# Patient Record
Sex: Male | Born: 1970 | Race: White | Hispanic: No | Marital: Married | Smoking: Current every day smoker
Health system: Southern US, Community
[De-identification: ages and names within clinical notes are randomized; demographics above are authoritative.]

## PROBLEM LIST (undated history)

## (undated) ENCOUNTER — Emergency Department: Admission: EM | Discharge status: Absent without leave | Payer: Self-pay

## (undated) HISTORY — PX: SINUS SURGERY WITH INSTATRAK: SHX5215

## (undated) HISTORY — PX: ANKLE SURGERY: SHX546

## (undated) HISTORY — PX: CHOLECYSTECTOMY: SHX55

---

## 2011-09-29 ENCOUNTER — Emergency Department
Admission: EM | Admit: 2011-09-29 | Discharge: 2011-09-29 | Disposition: A | Discharge status: Home / Self Care | Payer: Managed Care, Other (non HMO) | Source: Home / Self Care

## 2011-09-29 ENCOUNTER — Emergency Department
Admit: 2011-09-29 | Discharge: 2011-09-29 | Disposition: A | Discharge status: Home / Self Care | Payer: Managed Care, Other (non HMO)

## 2011-09-29 DIAGNOSIS — IMO0001 Reserved for inherently not codable concepts without codable children: Secondary | ICD-10-CM

## 2011-09-29 DIAGNOSIS — R059 Cough, unspecified: Secondary | ICD-10-CM

## 2011-09-29 DIAGNOSIS — R5381 Other malaise: Secondary | ICD-10-CM

## 2011-09-29 DIAGNOSIS — R5383 Other fatigue: Secondary | ICD-10-CM

## 2011-09-29 DIAGNOSIS — R05 Cough: Secondary | ICD-10-CM

## 2011-09-29 MED ORDER — BENZONATATE 100 MG PO CAPS: 100.0000 mg | ORAL_CAPSULE | Freq: Three times a day (TID) | ORAL | Status: AC | PRN

## 2011-09-29 NOTE — ED Provider Notes (Signed)
Subjective:    Patient ID: Eugene Fleming is a 41 y.o. male.  Chief Complaint:  HPI Comments: Flu like sxs x 5 days. + sick contacts in children with similar sxs. Pt with a baseline hx/o smoking (1/2 PPD). Pt has not had flu shot this year.   Patient is a 41 y.o. male presenting with fever.  Fever Primary symptoms of the febrile illness include fever, fatigue, headaches, cough, nausea, vomiting and myalgias. Primary symptoms do not include wheezing, shortness of breath or dysuria. The current episode started 3 to 5 days ago. This is a new problem. The problem has not changed since onset. Associated with: + sick contact in children with similar symptoms  Risk factors: morbid obesity, 1/2 PPD smoker   No past medical history on file.  No past surgical history on file.  Family History  Problem Relation Age of Onset  . Diabetes Father     History  Substance Use Topics  . Smoking status: Not on file  . Smokeless tobacco: Not on file  . Alcohol Use:     Review of Systems  Constitutional: Positive for fever and fatigue.  HENT: Positive for congestion, rhinorrhea and postnasal drip. Negative for sore throat and trouble swallowing.   Respiratory: Positive for cough. Negative for shortness of breath and wheezing.   Cardiovascular:       + pleuritic chest pain with persistent coughing   Gastrointestinal: Positive for nausea and vomiting.  Genitourinary: Negative for dysuria.  Musculoskeletal: Positive for myalgias.  Neurological: Positive for headaches.    Objective:   BP 107/74  Pulse 105  Temp 99 F (37.2 C)  Resp 24  Ht 6\' 4"  (1.93 m)  Wt 323 lb 12 oz (146.852 kg)  BMI 39.41 kg/m2  SpO2 99%  Physical Exam  Constitutional: He is oriented to person, place, and time.       Obese, generally ill appearing   HENT:       TMs clear bilaterally, +nasal erythema, rhinorrhea bilaterally, + post oropharyngeal erythema    Eyes: Pupils are equal, round, and reactive to light.    Neck:       Large neck girth   Cardiovascular: Normal rate and regular rhythm.   Pulmonary/Chest: Effort normal and breath sounds normal. No respiratory distress. He has no wheezes. He has no rales.  Abdominal:       Obese abdomen, non tender   Neurological: He is alert and oriented to person, place, and time.    Assessment:   Procedures CHEST - 2 VIEW  Comparison: None.  Findings: Two views of the chest were obtained. Heart and  mediastinum are within normal limits. The trachea is midline.  Lungs are clear. The bony thorax is intact.  IMPRESSION:  No acute chest findings.  Original Report Authenticated By: Richarda Overlie, M.D.       There were no encounter diagnoses.  Plan:  Likely viral URI. CXR negative for pneumonia in setting of smoking history. Discussed supportive care and cardiorespiratory red flags for return. Tessalon perles for cough. Handout given.

## 2011-09-29 NOTE — ED Notes (Signed)
Cough, congestion and fever started Wed. T- Max= 102.0

## 2011-09-29 NOTE — ED Provider Notes (Signed)
Pt seen by Dr. Doroteo Bradford, MD 09/29/11 1536

## 2011-09-29 NOTE — Discharge Instructions (Signed)
Influenza, Adult Influenza ("the flu") is a viral infection of the respiratory tract. It causes chills, fever, cough, headache, body aches, and sore throat. Influenza in general will make you feel sicker than when you have a common cold. Symptoms of the illness typically last a few days. Cough and fatigue may continue for as [place] as 7 to 10 days. Influenza is highly contagious. It spreads easily to others in the droplets from coughs and sneezes. People frequently become infected by touching something that was recently contaminated with the virus and then touch their mouth, nose or eyes. This infection is caused by a virus. Symptoms will not be reduced or improved by taking an antibiotic. Antibiotics are medications that kill bacteria, not viruses. DIAGNOSIS  Diagnosis of influenza is often made based on the history and physical examination as well as the presence of influenza reports occurring in your community. Testing can be done if the diagnosis is not certain. TREATMENT  Since influenza is caused by a virus, antibiotics are not helpful. Your caregiver may prescribe antiviral medicines to shorten the illness and lessen the severity. Your caregiver may also recommend influenza vaccination and/or antiviral medicines for your family members in order to prevent the spread of influenza to them. HOME CARE INSTRUCTIONS  DO NOT GIVE ASPIRIN TO PERSONS WITH INFLUENZA WHO ARE UNDER 40 YEARS OF AGE. This could lead to brain and liver damage (Reye's syndrome). Read the label on over-the-counter medicines.   Stay home from work or school if at all possible until most of your symptoms are gone.   Only take over-the-counter or prescription medicines for pain, discomfort, or fever as directed by your caregiver.   Use a cool mist humidifier to increase air moisture. This will make breathing easier.   Rest until your temperature is nearly normal: 98.6 F (37 C). This usually takes 3 to 4 days. Be sure you get  plenty of rest.   Drink at least eight, eight-ounce glasses of fluids per day. Fluids include water, juice, broth, gelatin, or lemonade.   Cover your mouth and nose when coughing or sneezing and wash your hands often to prevent the spread of this virus to other persons.  PREVENTION  Annual influenza vaccination (flu shots) is the best way to avoid getting influenza. An annual flu shot is now routinely recommended for all adults in the [place] SEEK MEDICAL CARE IF:   You develop shortness of breath while resting.   You have a deep cough with production of mucous or chest pain.   You develop nausea (feeling sick to your stomach), vomiting, or diarrhea.   Fever despite high dose ibuprofen 800mg  every 6 hours and tylenol 1000mg  every 8 hours SEEK IMMEDIATE MEDICAL CARE IF:   You have difficulty breathing, become short of breath, or your skin or nails turn bluish.   You develop severe neck pain or stiffness.   You develop a severe headache, facial pain, or earache.   You have a fever.   You develop nausea or vomiting that cannot be controlled.  Document Released: 07/06/2000 Document Revised: 06/28/2011 Document Reviewed: 05/11/2009 [facility] Patient Information 2012 [place], [place].

## 2015-08-30 ENCOUNTER — Encounter (HOSPITAL_BASED_OUTPATIENT_CLINIC_OR_DEPARTMENT_OTHER): Payer: Self-pay

## 2015-08-30 ENCOUNTER — Emergency Department (HOSPITAL_BASED_OUTPATIENT_CLINIC_OR_DEPARTMENT_OTHER)
Admission: EM | Admit: 2015-08-30 | Discharge: 2015-08-30 | Disposition: A | Discharge status: Home / Self Care | Payer: 59 | Attending: Emergency Medicine | Admitting: Emergency Medicine

## 2015-08-30 ENCOUNTER — Emergency Department (HOSPITAL_BASED_OUTPATIENT_CLINIC_OR_DEPARTMENT_OTHER): Payer: 59

## 2015-08-30 DIAGNOSIS — F17200 Nicotine dependence, unspecified, uncomplicated: Secondary | ICD-10-CM | POA: Insufficient documentation

## 2015-08-30 DIAGNOSIS — R1011 Right upper quadrant pain: Secondary | ICD-10-CM | POA: Diagnosis present

## 2015-08-30 DIAGNOSIS — N200 Calculus of kidney: Secondary | ICD-10-CM | POA: Insufficient documentation

## 2015-08-30 DIAGNOSIS — Z9049 Acquired absence of other specified parts of digestive tract: Secondary | ICD-10-CM | POA: Diagnosis not present

## 2015-08-30 LAB — COMPREHENSIVE METABOLIC PANEL
ALBUMIN: 4 g/dL (ref 3.5–5.0)
ALK PHOS: 76 U/L (ref 38–126)
ALT: 27 U/L (ref 17–63)
ANION GAP: 10 (ref 5–15)
AST: 22 U/L (ref 15–41)
BUN: 11 mg/dL (ref 6–20)
CO2: 26 mmol/L (ref 22–32)
Calcium: 9 mg/dL (ref 8.9–10.3)
Chloride: 105 mmol/L (ref 101–111)
Creatinine, Ser: 1.15 mg/dL (ref 0.61–1.24)
GFR calc Af Amer: >60 mL/min (ref >60)
GFR calc non Af Amer: >60 mL/min (ref >60)
GLUCOSE: 111 mg/dL — AB (ref 65–99)
POTASSIUM: 4 mmol/L (ref 3.5–5.1)
SODIUM: 141 mmol/L (ref 135–145)
Total Bilirubin: 0.6 mg/dL (ref 0.3–1.2)
Total Protein: 7.7 g/dL (ref 6.5–8.1)

## 2015-08-30 LAB — CBC WITH DIFFERENTIAL/PLATELET
BASOS ABS: 0 10*3/uL (ref 0.0–0.1)
BASOS PCT: 0 %
EOS ABS: 0.2 10*3/uL (ref 0.0–0.7)
Eosinophils Relative: 1 %
HEMATOCRIT: 47.2 % (ref 39.0–52.0)
HEMOGLOBIN: 15.7 g/dL (ref 13.0–17.0)
LYMPHS PCT: 12 %
Lymphs Abs: 2.7 10*3/uL (ref 0.7–4.0)
MCH: 28.4 pg (ref 26.0–34.0)
MCHC: 33.3 g/dL (ref 30.0–36.0)
MCV: 85.5 fL (ref 78.0–100.0)
MONO ABS: 1.1 10*3/uL — AB (ref 0.1–1.0)
Monocytes Relative: 5 %
NEUTROS PCT: 82 %
Neutro Abs: 18.8 10*3/uL — ABNORMAL HIGH (ref 1.7–7.7)
Platelets: 425 10*3/uL — ABNORMAL HIGH (ref 150–400)
RBC: 5.52 MIL/uL (ref 4.22–5.81)
RDW: 13.9 % (ref 11.5–15.5)
WBC: 22.8 10*3/uL — ABNORMAL HIGH (ref 4.0–10.5)

## 2015-08-30 LAB — URINE MICROSCOPIC-ADD ON

## 2015-08-30 LAB — URINALYSIS, ROUTINE W REFLEX MICROSCOPIC
Bilirubin Urine: NEGATIVE
GLUCOSE, UA: NEGATIVE mg/dL
Ketones, ur: NEGATIVE mg/dL
Nitrite: NEGATIVE
PH: 5.5 (ref 5.0–8.0)
PROTEIN: NEGATIVE mg/dL
SPECIFIC GRAVITY, URINE: 1.031 — AB (ref 1.005–1.030)

## 2015-08-30 MED ORDER — TAMSULOSIN HCL 0.4 MG PO CAPS: 0.4000 mg | ORAL_CAPSULE | Freq: Every day | ORAL | Status: AC

## 2015-08-30 MED ORDER — IOHEXOL 300 MG/ML  SOLN
25.0000 mL | Freq: Once | INTRAMUSCULAR | Status: AC | PRN
  Administered 2015-08-30: 25 mL via ORAL

## 2015-08-30 MED ORDER — IOHEXOL 300 MG/ML  SOLN
150.0000 mL | Freq: Once | INTRAMUSCULAR | Status: AC | PRN
  Administered 2015-08-30: 150 mL via INTRAVENOUS

## 2015-08-30 MED ORDER — ONDANSETRON 4 MG PO TBDP: 4.0000 mg | ORAL_TABLET | Freq: Three times a day (TID) | ORAL | Status: AC | PRN

## 2015-08-30 MED ORDER — MORPHINE SULFATE (PF) 4 MG/ML IV SOLN
4.0000 mg | Freq: Once | INTRAVENOUS | Status: AC
  Administered 2015-08-30: 4 mg via INTRAVENOUS
  Filled 2015-08-30: qty 1

## 2015-08-30 MED ORDER — IOHEXOL 300 MG/ML  SOLN: 25.0000 mL | Freq: Once | INTRAMUSCULAR | Status: DC | PRN

## 2015-08-30 MED ORDER — NAPROXEN 500 MG PO TABS: 500.0000 mg | ORAL_TABLET | Freq: Two times a day (BID) | ORAL | Status: AC

## 2015-08-30 MED ORDER — HYDROCODONE-ACETAMINOPHEN 5-325 MG PO TABS: 1.0000 | ORAL_TABLET | ORAL | Status: AC | PRN

## 2015-08-30 MED ORDER — ONDANSETRON HCL 4 MG/2ML IJ SOLN
4.0000 mg | Freq: Once | INTRAMUSCULAR | Status: AC
  Administered 2015-08-30: 4 mg via INTRAVENOUS
  Filled 2015-08-30: qty 2

## 2015-08-30 NOTE — ED Notes (Signed)
MD at bedside. 

## 2015-08-30 NOTE — ED Notes (Signed)
Pt drinking contrast. 

## 2015-08-30 NOTE — ED Notes (Signed)
Pt is actively vomiting in waiting room, continues to have RLQ abdominal pain.  Informed charge nurse regarding pt.

## 2015-08-30 NOTE — ED Notes (Signed)
RLQ since 4am-vomited x 2-worked-pain increase approx 1 hour ago-went to urgent care-sent here for appy r/o

## 2015-08-30 NOTE — Discharge Instructions (Signed)
Kidney Stones °Kidney stones (urolithiasis) are deposits that form inside your kidneys. The intense pain is caused by the stone moving through the urinary tract. When the stone moves, the ureter goes into spasm around the stone. The stone is usually passed in the urine.  °CAUSES  °· A disorder that makes certain neck glands produce too much parathyroid hormone (primary hyperparathyroidism). °· A buildup of uric acid crystals, similar to gout in your joints. °· Narrowing (stricture) of the ureter. °· A kidney obstruction present at birth (congenital obstruction). °· Previous surgery on the kidney or ureters. °· Numerous kidney infections. °SYMPTOMS  °· Feeling sick to your stomach (nauseous). °· Throwing up (vomiting). °· Blood in the urine (hematuria). °· Pain that usually spreads (radiates) to the groin. °· Frequency or urgency of urination. °DIAGNOSIS  °· Taking a history and physical exam. °· Blood or urine tests. °· CT scan. °· Occasionally, an examination of the inside of the urinary bladder (cystoscopy) is performed. °TREATMENT  °· Observation. °· Increasing your fluid intake. °· Extracorporeal shock wave lithotripsy--This is a noninvasive procedure that uses shock waves to break up kidney stones. °· Surgery may be needed if you have severe pain or persistent obstruction. There are various surgical procedures. Most of the procedures are performed with the use of small instruments. Only small incisions are needed to accommodate these instruments, so recovery time is minimized. °The size, location, and chemical composition are all important variables that will determine the proper choice of action for you. Talk to your health care provider to better understand your situation so that you will minimize the risk of injury to yourself and your kidney.  °HOME CARE INSTRUCTIONS  °· Drink enough water and fluids to keep your urine clear or pale yellow. This will help you to pass the stone or stone fragments. °· Strain  all urine through the provided strainer. Keep all particulate matter and stones for your health care provider to see. The stone causing the pain may be as small as a grain of salt. It is very important to use the strainer each and every time you pass your urine. The collection of your stone will allow your health care provider to analyze it and verify that a stone has actually passed. The stone analysis will often identify what you can do to reduce the incidence of recurrences. °· Only take over-the-counter or prescription medicines for pain, discomfort, or fever as directed by your health care provider. °· Keep all follow-up visits as told by your health care provider. This is important. °· Get follow-up X-rays if required. The absence of pain does not always mean that the stone has passed. It may have only stopped moving. If the urine remains completely obstructed, it can cause loss of kidney function or even complete destruction of the kidney. It is your responsibility to make sure X-rays and follow-ups are completed. Ultrasounds of the kidney can show blockages and the status of the kidney. Ultrasounds are not associated with any radiation and can be performed easily in a matter of minutes. °· Make changes to your daily diet as told by your health care provider. You may be told to: °¨ Limit the amount of salt that you eat. °¨ Eat 5 or more servings of fruits and vegetables each day. °¨ Limit the amount of meat, poultry, fish, and eggs that you eat. °· Collect a 24-hour urine sample as told by your health care provider. You may need to collect another urine sample every 6-12   months. °SEEK MEDICAL CARE IF: °· You experience pain that is progressive and unresponsive to any pain medicine you have been prescribed. °SEEK IMMEDIATE MEDICAL CARE IF:  °· Pain cannot be controlled with the prescribed medicine. °· You have a fever or shaking chills. °· The severity or intensity of pain increases over 18 hours and is not  relieved by pain medicine. °· You develop a new onset of abdominal pain. °· You feel faint or pass out. °· You are unable to urinate. °  °This information is not intended to replace advice given to you by your health care provider. Make sure you discuss any questions you have with your health care provider. °  °Document Released: 07/09/2005 Document Revised: 03/30/2015 Document Reviewed: 12/10/2012 °Elsevier Interactive Patient Education ©2016 [employer] ° °

## 2015-08-30 NOTE — ED Notes (Signed)
Patient transported to CT 

## 2015-08-30 NOTE — ED Provider Notes (Signed)
CSN: [id number]     Arrival date & time 08/30/15  1550 History   By signing my name below, I, Arlan Organ, attest that this documentation has been prepared under the direction and in the presence of Alvira Monday, MD.  Electronically Signed: Arlan Organ, ED Scribe. 08/30/2015. 6:52 PM.   Chief Complaint  Patient presents with  . Abdominal Pain   Patient is a 45 y.o. male presenting with abdominal pain. The history is provided by the patient. No language interpreter was used.  Abdominal Pain Pain location:  RUQ Pain quality: cramping and sharp   Pain severity:  Moderate Onset quality:  Gradual Duration:  3 hours Timing:  Constant Progression:  Unchanged Chronicity:  New Relieved by:  None tried Worsened by:  Nothing tried Ineffective treatments:  Bowel activity Associated symptoms: nausea and vomiting   Associated symptoms: no chest pain, no chills, no cough, no diarrhea, no fever, no shortness of breath and no sore throat     HPI Comments: Eugene Fleming is a 45 y.o. male without any pertinent past medical history who presents to the Emergency Department complaining of constant, ongoing R sided abdominal pain that radiates to the back onset 4:00 PM this afternoon. Pt states pain feels similar to "getting hit in the stomach". Pt also reports associated nausea, vomiting, and decreased appetite. Abdominal pain is described as sharp and cramp like. No aggravating factors at this time. However, pt states discomfort is midly alleviating after vomiting. Last normal bowel movement earlier this afternoon. No black or tarry stools noted. No recent fever or chills. PSHx includes cholecystectomy.  PCP: No primary care provider on file.    History reviewed. No pertinent past medical history. Past Surgical History  Procedure Laterality Date  . Cholecystectomy    . Sinus surgery with instatrak    . Ankle surgery     Family History  Problem Relation Age of Onset  . Diabetes Father     Social History  Substance Use Topics  . Smoking status: Current Every Day Smoker  . Smokeless tobacco: None  . Alcohol Use: No    Review of Systems  Constitutional: Positive for appetite change. Negative for fever and chills.  HENT: Negative for sore throat.   Eyes: Negative for visual disturbance.  Respiratory: Negative for cough and shortness of breath.   Cardiovascular: Negative for chest pain.  Gastrointestinal: Positive for nausea, vomiting and abdominal pain. Negative for diarrhea.  Genitourinary: Negative for difficulty urinating.  Musculoskeletal: Negative for back pain and neck stiffness.  Skin: Negative for rash.  Neurological: Negative for syncope and headaches.  Psychiatric/Behavioral: Negative for confusion.      Allergies  Review of patient's allergies indicates no known allergies.  Home Medications   Prior to Admission medications   Medication Sig Start Date End Date Taking? Authorizing Provider  HYDROcodone-acetaminophen (NORCO/VICODIN) 5-325 MG tablet Take 1-2 tablets by mouth every 4 (four) hours as needed. 08/30/15   Alvira Monday, MD  naproxen (NAPROSYN) 500 MG tablet Take 1 tablet (500 mg total) by mouth 2 (two) times daily with a meal. 08/30/15   Alvira Monday, MD  ondansetron (ZOFRAN ODT) 4 MG disintegrating tablet Take 1 tablet (4 mg total) by mouth every 8 (eight) hours as needed for nausea or vomiting. 08/30/15   Alvira Monday, MD  tamsulosin (FLOMAX) 0.4 MG CAPS capsule Take 1 capsule (0.4 mg total) by mouth daily. 08/30/15   Alvira Monday, MD   Triage Vitals: BP 133/56 mmHg  Pulse 83  Temp(Src) 98.1 F (36.7 C) (Oral)  Resp 20  Ht 6\' 4"  (1.93 m)  Wt 390 lb (176.903 kg)  BMI 47.49 kg/m2  SpO2 93%   Physical Exam  Constitutional: He is oriented to person, place, and time. He appears well-developed and well-nourished.  HENT:  Head: Normocephalic and atraumatic.  Eyes: EOM are normal.  Neck: Normal range of motion.  Cardiovascular: Normal  rate, regular rhythm, normal heart sounds and intact distal pulses.   Pulmonary/Chest: Effort normal and breath sounds normal. No respiratory distress.  Abdominal: Soft. He exhibits no distension. There is tenderness. There is no rebound and no guarding.  Tenderness to R lower side  Genitourinary:  No CVA tenderness  Musculoskeletal: Normal range of motion.  Neurological: He is alert and oriented to person, place, and time.  Skin: Skin is warm and dry.  Psychiatric: He has a normal mood and affect. Judgment normal.  Nursing note and vitals reviewed.   ED Course  Procedures (including critical care time)  DIAGNOSTIC STUDIES: Oxygen Saturation is 100% on RA, Normal by my interpretation.    COORDINATION OF CARE: 6:22 PM- Will order CT abdomen pelvis with contrast, CMP, CBC, and urinalysis. Will give Zofran. Discussed treatment plan with pt at bedside and pt agreed to plan.     Labs Review Labs Reviewed  URINALYSIS, ROUTINE W REFLEX MICROSCOPIC (NOT AT [facility]) - Abnormal; Notable for the following:    Specific Gravity, Urine 1.031 (*)    Hgb urine dipstick MODERATE (*)    Leukocytes, UA TRACE (*)    All other components within normal limits  URINE MICROSCOPIC-ADD ON - Abnormal; Notable for the following:    Squamous Epithelial / LPF 0-5 (*)    Bacteria, UA RARE (*)    All other components within normal limits  CBC WITH DIFFERENTIAL/PLATELET - Abnormal; Notable for the following:    WBC 22.8 (*)    Platelets 425 (*)    Neutro Abs 18.8 (*)    Monocytes Absolute 1.1 (*)    All other components within normal limits  COMPREHENSIVE METABOLIC PANEL - Abnormal; Notable for the following:    Glucose, Bld 111 (*)    All other components within normal limits    Imaging Review Ct Abdomen Pelvis W Contrast  08/30/2015  CLINICAL DATA:  45 year old male with right lower quadrant abdominal pain EXAM: CT ABDOMEN AND PELVIS WITH CONTRAST TECHNIQUE: Multidetector CT imaging of the abdomen and  pelvis was performed using the standard protocol following bolus administration of intravenous contrast. CONTRAST:  OMNIPAQUE IOHEXOL 300 MG/ML SOLN, 25mL OMNIPAQUE IOHEXOL 300 MG/ML SOLN COMPARISON:  None. FINDINGS: The visualized lung bases are clear. No intra-abdominal free air or free fluid. Cholecystectomy. Diffuse hepatic steatosis. The pancreas, spleen, adrenal glands, and the left kidney, left ureter appear unremarkable. There is a 6 mm distal right ureteral calculus with mild right hydronephrosis. There is mild right perinephric stranding and haziness. Correlation with urinalysis recommended to exclude superimposed UTI. The urinary bladder is predominantly collapsed. The prostate and seminal vesicles are grossly unremarkable. Small scattered sigmoid diverticula without active inflammatory changes. There is no evidence of bowel obstruction or inflammation. Normal appendix. Mild aortoiliac atherosclerotic disease. No portal venous gas identified. The IVC appears unremarkable. There is no adenopathy. The abdominal wall soft tissues and the osseous structures appear unremarkable. IMPRESSION: A 6 mm distal right ureteral stone with mild right hydronephrosis. Electronically Signed   By: Elgie Collard M.D.   On: 08/30/2015 21:32   I  have personally reviewed and evaluated these images and lab results as part of my medical decision-making.   EKG Interpretation None      MDM   Final diagnoses:  Right nephrolithiasis   45 year old male with history of nephrolithiasis presents with concern of right lower quadrant pain. CT abdomen and pelvis was performed with contrast and showed a 6 mm distal right ureteral stone with mild right hydronephrosis. There is no sign of urinary tract infection. Creatinine is within normal limits. Patient was given a prescription for naproxen, Flomax, Zofran, and Norco. Provided number for urology follow-up, recommended hydration and straining urine. Patient discharged  in stable condition with understanding of reasons to return.   I personally performed the services described in this documentation, which was scribed in my presence. The recorded information has been reviewed and is accurate.   Alvira Monday, MD 08/31/15 1452

## 2017-05-01 IMAGING — CT CT ABD-PELV W/ CM
2 of 5 series · 17 of 46 positions shown, 19 images · IV contrast (APPLIED)
Comparison: None.

CLINICAL DATA: 44-year-old male with right lower quadrant abdominal
pain

EXAM:
CT ABDOMEN AND PELVIS WITH CONTRAST
TECHNIQUE: Multidetector CT imaging of the abdomen and pelvis was performed
using the standard protocol following bolus administration of
intravenous contrast.
CONTRAST:  150mL OMNIPAQUE IOHEXOL 300 MG/ML SOLN, 25mL OMNIPAQUE
IOHEXOL 300 MG/ML SOLN

[Series 2: axial st · axial · 0.91mm/px · z∈[-549,-19]mm · 14 of 120 slices shown, 16 images]
[im 7/120  soft-tissue]
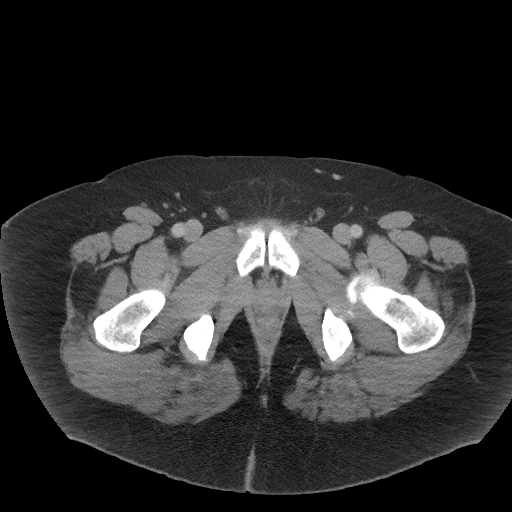
[im 7/120  bone]
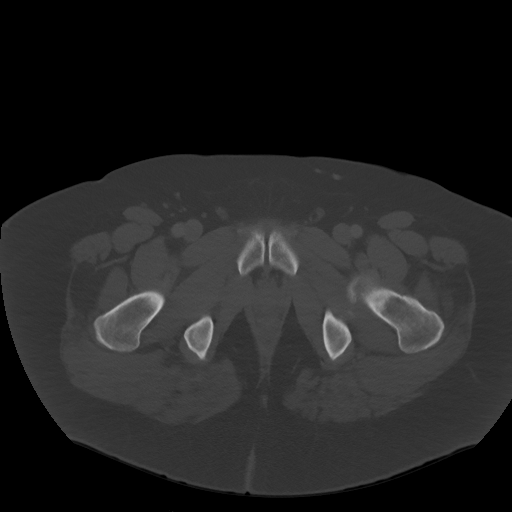
[im 13/120  soft-tissue]
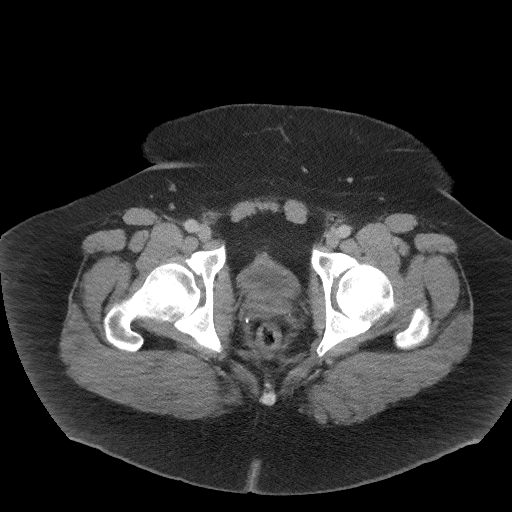
[im 26/120  soft-tissue]
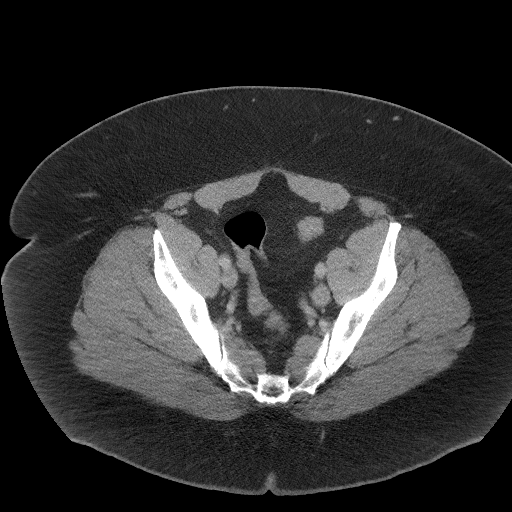
[im 32/120  soft-tissue]
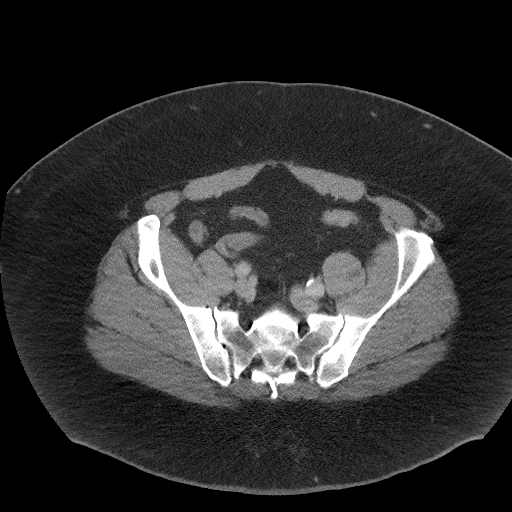
[im 38/120  soft-tissue]
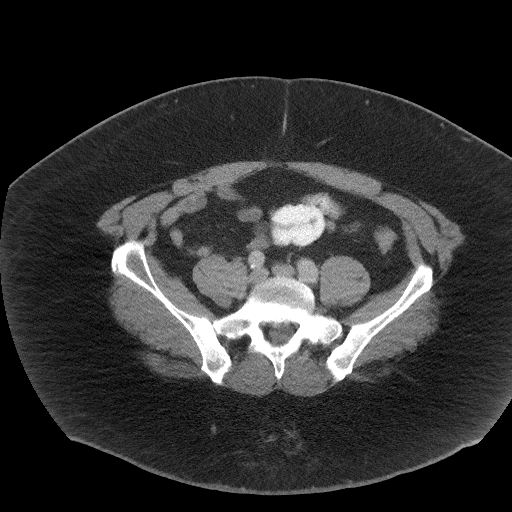
[im 51/120  soft-tissue]
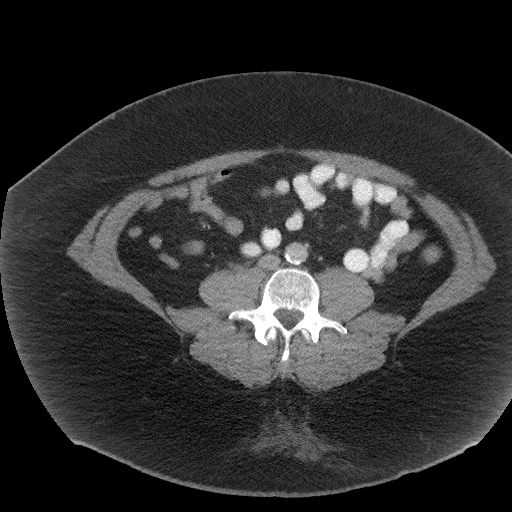
[im 57/120  soft-tissue]
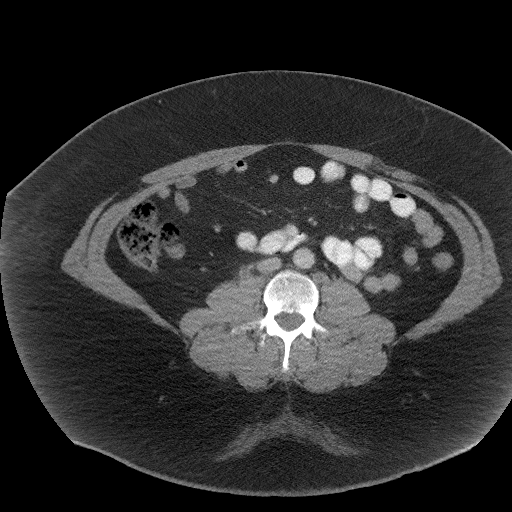
[im 63/120  soft-tissue]
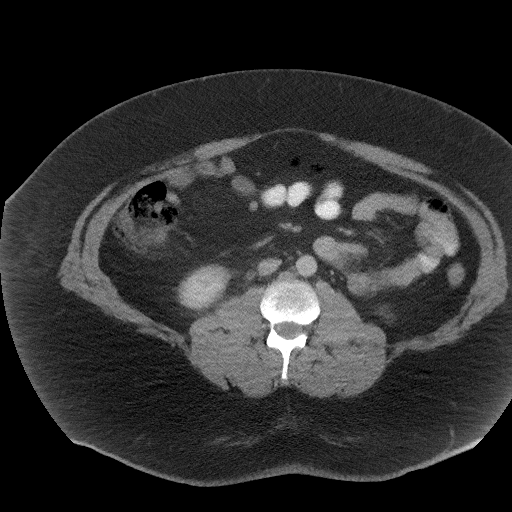
[im 69/120  soft-tissue]
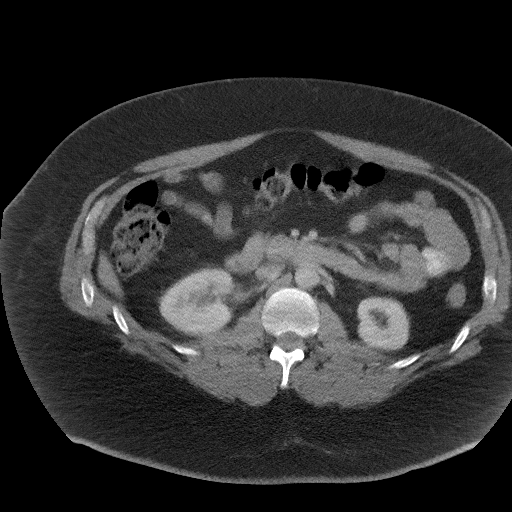
[im 69/120  bone]
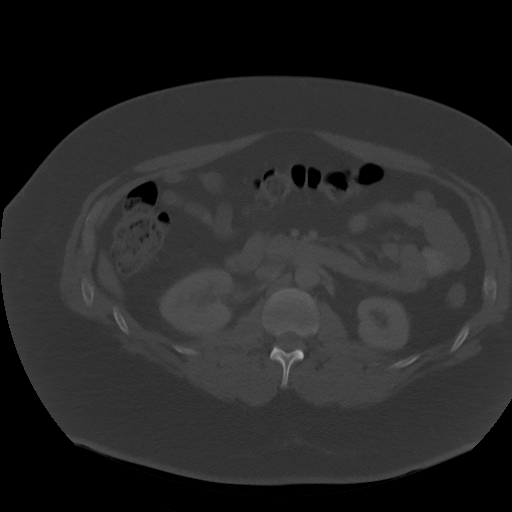
[im 82/120  soft-tissue]
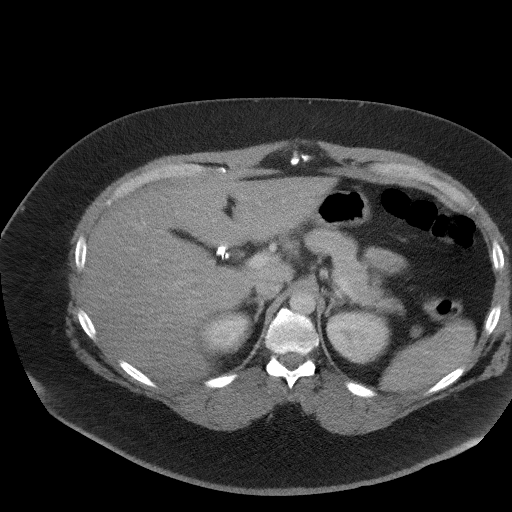
[im 88/120  soft-tissue]
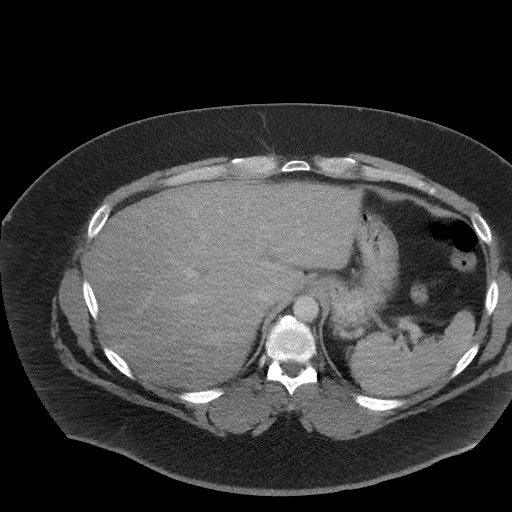
[im 94/120  soft-tissue]
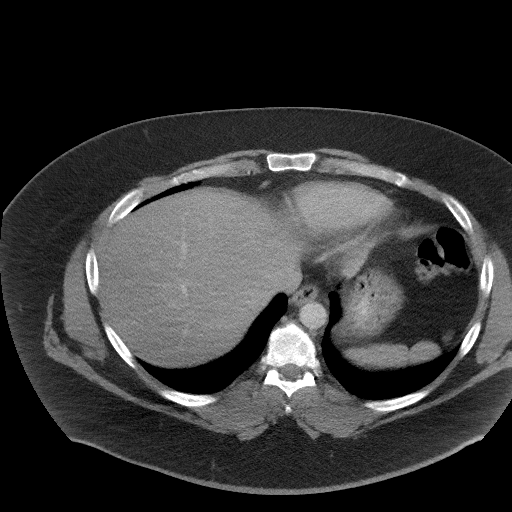
[im 107/120  soft-tissue]
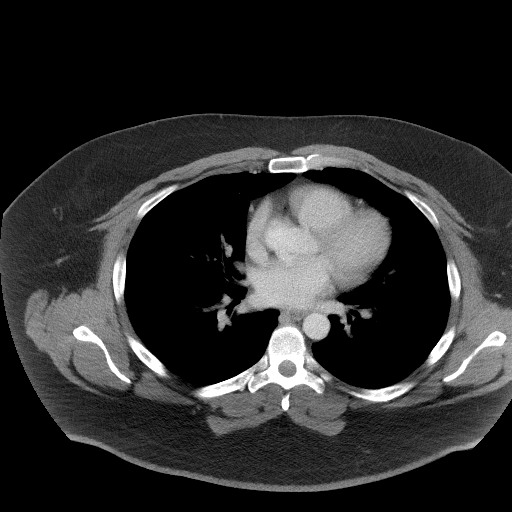
[im 113/120  soft-tissue]
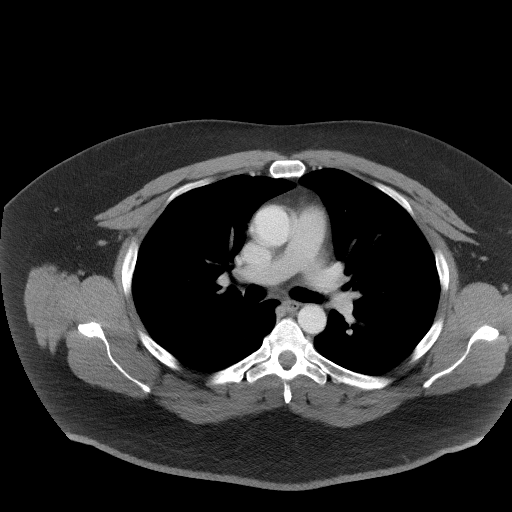

[Series 4: coronal st · coronal · 1.02mm/px · 3 of 104 slices shown]
[im 35/104  soft-tissue]
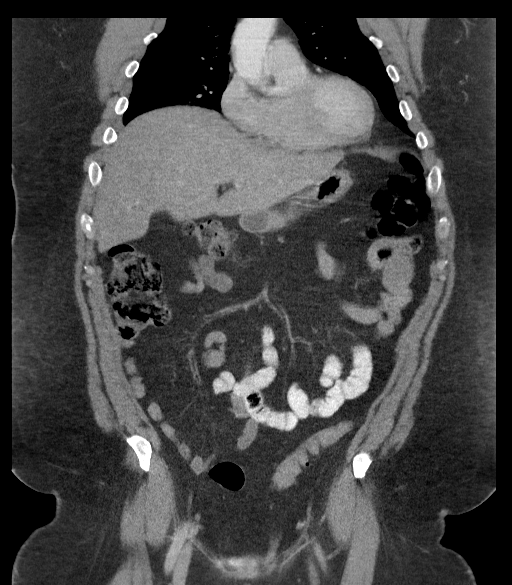
[im 46/104  soft-tissue]
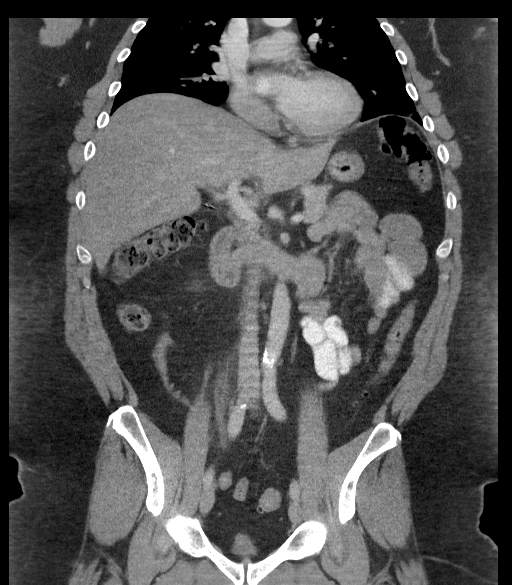
[im 58/104  soft-tissue]
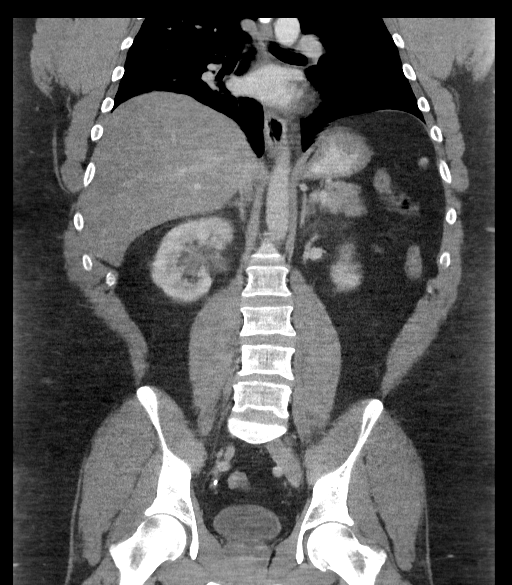

[17 of 46 positions shown; findings below may reference images not displayed]

FINDINGS: The visualized lung bases are clear. No intra-abdominal free air or
free fluid.

Cholecystectomy. Diffuse hepatic steatosis. The pancreas, spleen,
adrenal glands, and the left kidney, left ureter appear
unremarkable. There is a 6 mm distal right ureteral calculus with
mild right hydronephrosis. There is mild right perinephric stranding
and haziness. Correlation with urinalysis recommended to exclude
superimposed UTI. The urinary bladder is predominantly collapsed.
The prostate and seminal vesicles are grossly unremarkable.

Small scattered sigmoid diverticula without active inflammatory
changes. There is no evidence of bowel obstruction or inflammation.
Normal appendix.

Mild aortoiliac atherosclerotic disease. No portal venous gas
identified. The IVC appears unremarkable. There is no adenopathy.
The abdominal wall soft tissues and the osseous structures appear
unremarkable.
IMPRESSION: A 6 mm distal right ureteral stone with mild right hydronephrosis.
